# Patient Record
Sex: Female | Born: 1955 | Race: White | Hispanic: No | Marital: Single
Health system: Southern US, Community
[De-identification: ages and names within clinical notes are randomized; demographics above are authoritative.]

---

## 1999-06-22 ENCOUNTER — Other Ambulatory Visit: Admission: RE | Admit: 1999-06-22 | Discharge: 1999-06-22 | Payer: Self-pay | Admitting: Obstetrics and Gynecology

## 2000-07-03 ENCOUNTER — Other Ambulatory Visit: Admission: RE | Admit: 2000-07-03 | Discharge: 2000-07-03 | Payer: Self-pay | Admitting: Obstetrics and Gynecology

## 2001-07-10 ENCOUNTER — Other Ambulatory Visit: Admission: RE | Admit: 2001-07-10 | Discharge: 2001-07-10 | Payer: Self-pay | Admitting: Obstetrics and Gynecology

## 2002-05-12 ENCOUNTER — Other Ambulatory Visit: Admission: RE | Admit: 2002-05-12 | Discharge: 2002-05-12 | Payer: Self-pay | Admitting: Obstetrics and Gynecology

## 2002-08-06 ENCOUNTER — Other Ambulatory Visit: Admission: RE | Admit: 2002-08-06 | Discharge: 2002-08-06 | Payer: Self-pay | Admitting: Obstetrics and Gynecology

## 2003-08-20 ENCOUNTER — Other Ambulatory Visit: Admission: RE | Admit: 2003-08-20 | Discharge: 2003-08-20 | Payer: Self-pay | Admitting: Obstetrics and Gynecology

## 2003-09-08 ENCOUNTER — Ambulatory Visit (HOSPITAL_BASED_OUTPATIENT_CLINIC_OR_DEPARTMENT_OTHER): Admission: RE | Admit: 2003-09-08 | Discharge: 2003-09-08 | Payer: Self-pay | Admitting: Surgery

## 2003-09-08 ENCOUNTER — Encounter (INDEPENDENT_AMBULATORY_CARE_PROVIDER_SITE_OTHER): Payer: Self-pay | Admitting: *Deleted

## 2003-09-08 ENCOUNTER — Ambulatory Visit (HOSPITAL_COMMUNITY): Admission: RE | Admit: 2003-09-08 | Discharge: 2003-09-08 | Payer: Self-pay | Admitting: Surgery

## 2004-08-23 ENCOUNTER — Other Ambulatory Visit: Admission: RE | Admit: 2004-08-23 | Discharge: 2004-08-23 | Payer: Self-pay | Admitting: Obstetrics and Gynecology

## 2005-05-25 ENCOUNTER — Encounter: Admission: RE | Admit: 2005-05-25 | Discharge: 2005-05-25 | Payer: Self-pay | Admitting: Obstetrics and Gynecology

## 2005-09-14 ENCOUNTER — Encounter: Admission: RE | Admit: 2005-09-14 | Discharge: 2005-09-14 | Payer: Self-pay | Admitting: Obstetrics and Gynecology

## 2005-09-17 ENCOUNTER — Other Ambulatory Visit: Admission: RE | Admit: 2005-09-17 | Discharge: 2005-09-17 | Payer: Self-pay | Admitting: Obstetrics and Gynecology

## 2006-09-24 ENCOUNTER — Encounter: Admission: RE | Admit: 2006-09-24 | Discharge: 2006-09-24 | Payer: Self-pay | Admitting: Obstetrics and Gynecology

## 2007-10-09 ENCOUNTER — Encounter: Admission: RE | Admit: 2007-10-09 | Discharge: 2007-10-09 | Payer: Self-pay | Admitting: Obstetrics and Gynecology

## 2007-10-15 ENCOUNTER — Encounter: Admission: RE | Admit: 2007-10-15 | Discharge: 2007-10-15 | Payer: Self-pay | Admitting: Obstetrics and Gynecology

## 2008-03-18 ENCOUNTER — Ambulatory Visit (HOSPITAL_COMMUNITY): Admission: RE | Admit: 2008-03-18 | Discharge: 2008-03-18 | Payer: Self-pay | Admitting: Obstetrics and Gynecology

## 2008-10-12 ENCOUNTER — Encounter: Admission: RE | Admit: 2008-10-12 | Discharge: 2008-10-12 | Payer: Self-pay | Admitting: Obstetrics and Gynecology

## 2009-10-20 ENCOUNTER — Encounter: Admission: RE | Admit: 2009-10-20 | Discharge: 2009-10-20 | Payer: Self-pay | Admitting: Obstetrics and Gynecology

## 2009-12-15 IMAGING — CT CT ABDOMEN W/ CM
2 of 5 series · 17 of 46 positions shown, 19 images · IV contrast (agent unspecified)
Comparison: No priors

CT ABDOMEN

CLINICAL DATA: Pelvic and left flank pain

CT ABDOMEN AND PELVIS WITH CONTRAST
TECHNIQUE: Multidetector CT imaging of the abdomen and pelvis was
performed using the standard protocol following bolus
administration of intravenous contrast.
Contrast: 100 ml Smnipaque-KPP

[Series 2: abd_pel 5.0 b40s · axial · 0.70mm/px · z∈[+912,+1307]mm · 14 of 89 slices shown, 16 images]
[im 5/89  soft-tissue]
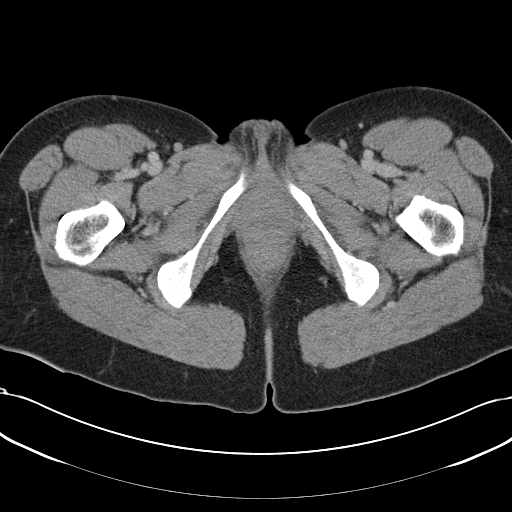
[im 5/89  bone]
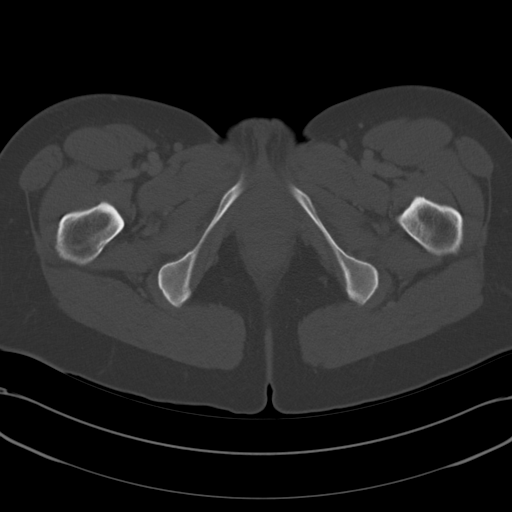
[im 14/89  soft-tissue]
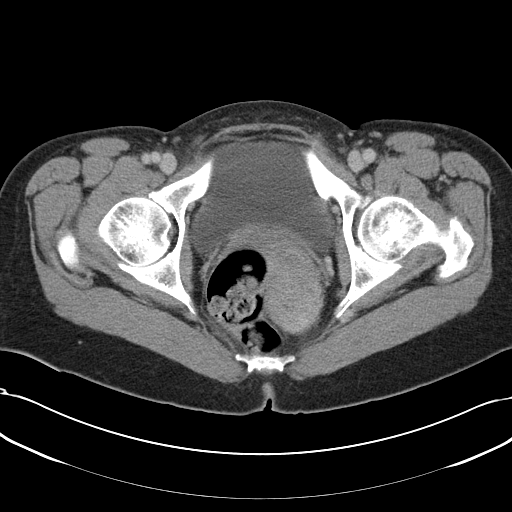
[im 18/89  soft-tissue]
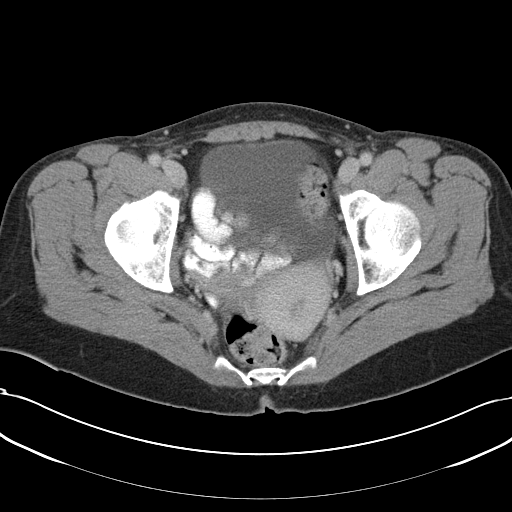
[im 23/89  soft-tissue]
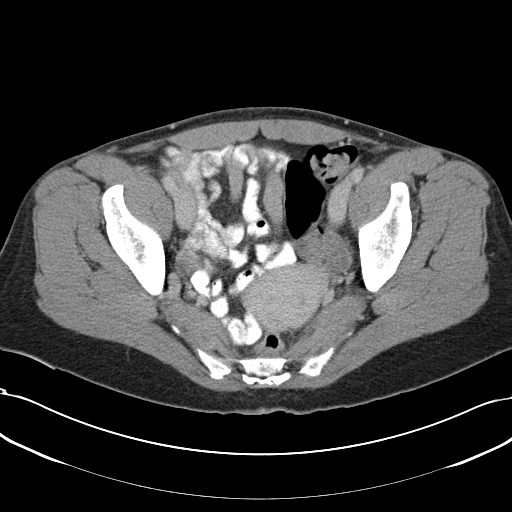
[im 31/89  soft-tissue]
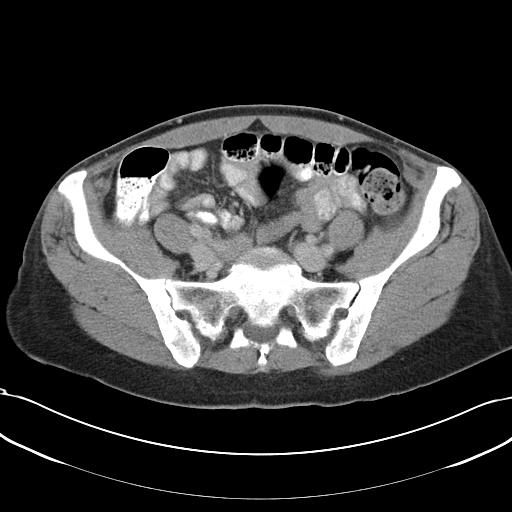
[im 36/89  soft-tissue]
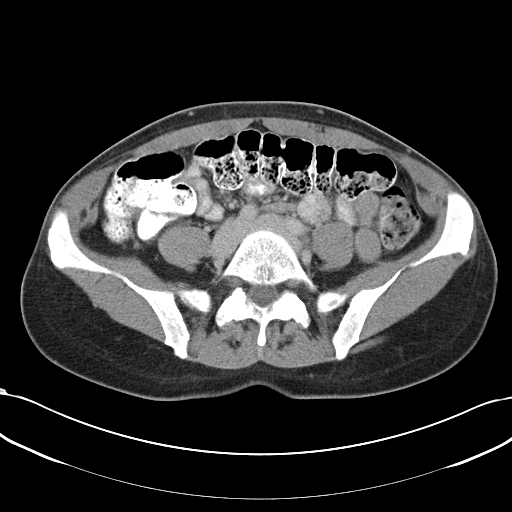
[im 40/89  soft-tissue]
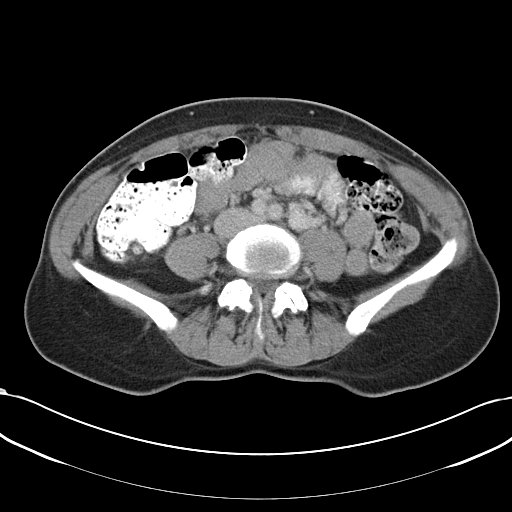
[im 49/89  soft-tissue]
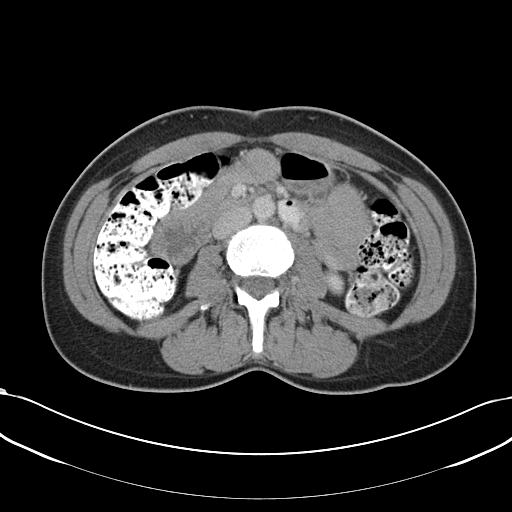
[im 53/89  soft-tissue]
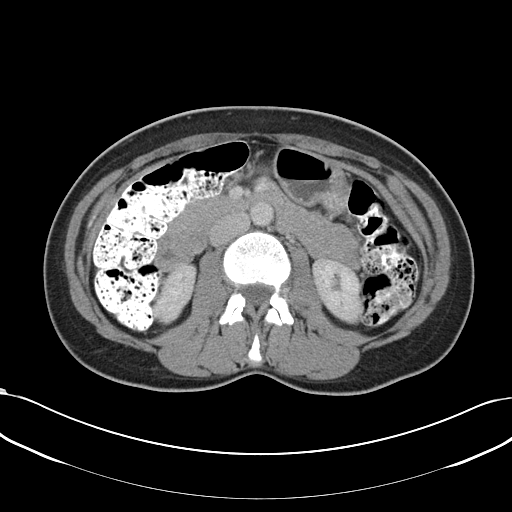
[im 53/89  bone]
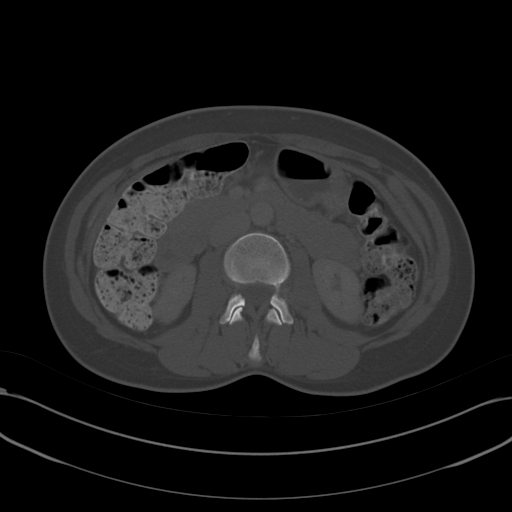
[im 58/89  soft-tissue]
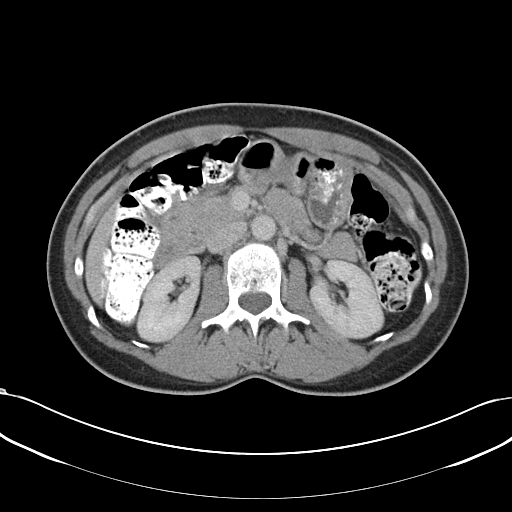
[im 67/89  soft-tissue]
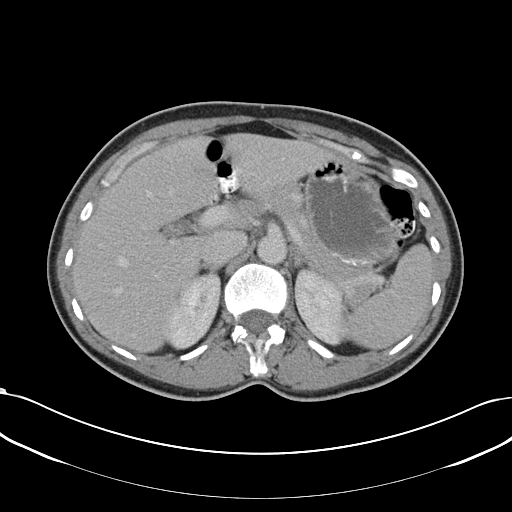
[im 71/89  soft-tissue]
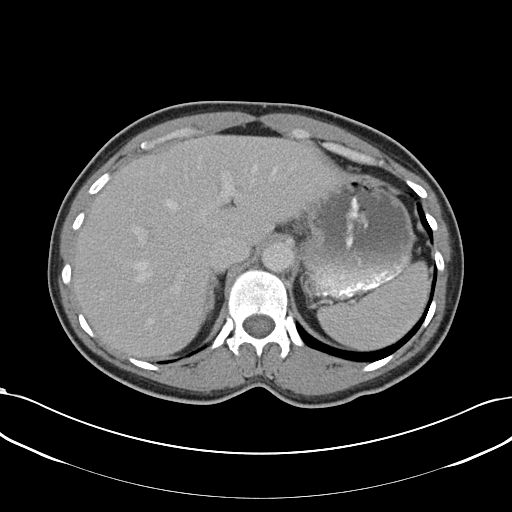
[im 75/89  soft-tissue]
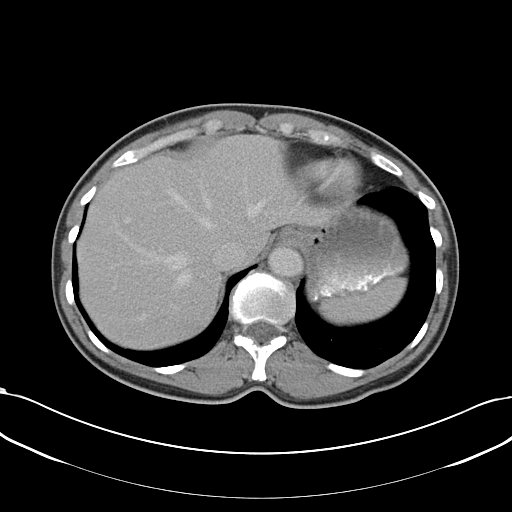
[im 84/89  soft-tissue]
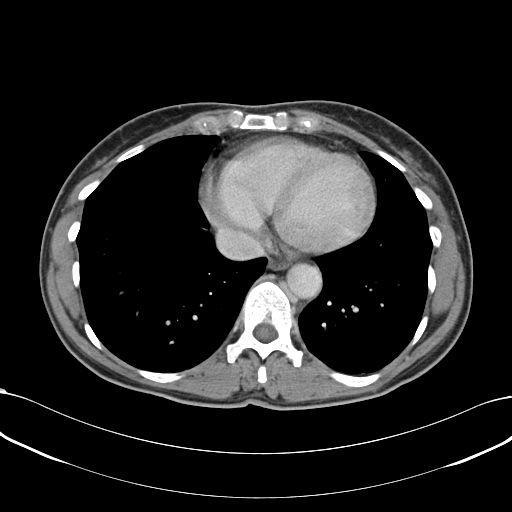

[Series 602: coronal images · coronal · 0.87mm/px · 3 of 70 slices shown]
[im 24/70  soft-tissue]
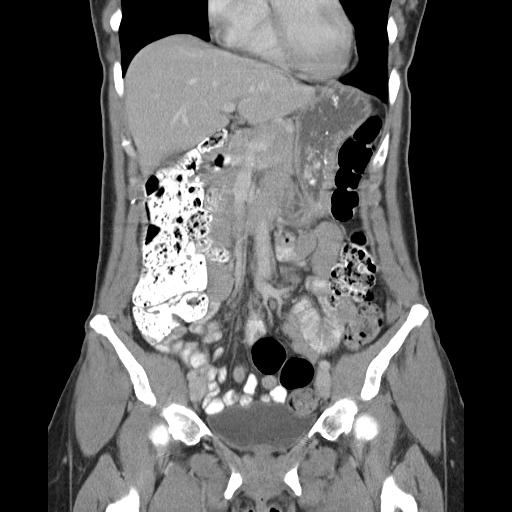
[im 31/70  soft-tissue]
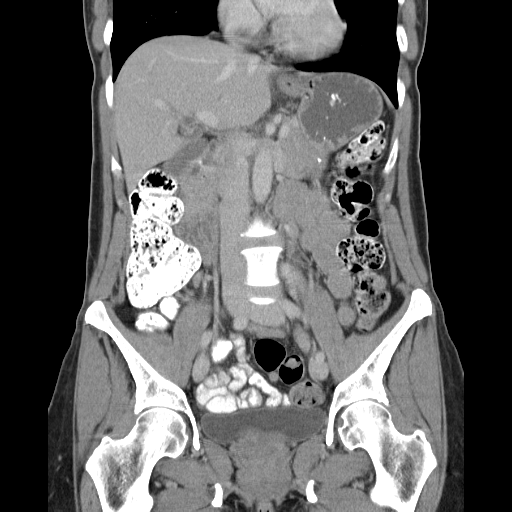
[im 39/70  soft-tissue]
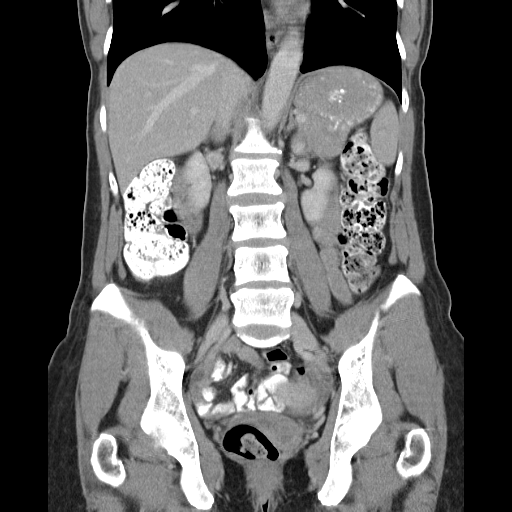

[17 of 46 positions shown; findings below may reference images not displayed]

FINDINGS: The lung bases are clear.  Liver, spleen, pancreas, and
adrenal glands normal.  There is a delayed images of the kidneys
show no obstruction or other abnormality.

No calcified gallstones or biliary dilatation.  No adenopathy or
ascites.  Visualized bowel normal.
IMPRESSION: Normal examination

CT PELVIS
FINDINGS: No focal masses, adenopathy, or fluid collections.  No
free pelvic fluid.  Pelvic sidewalls and presacral space normal.
No inflammatory changes in the large or small bowel.  I do not see
the appendix, but there are no inflammatory changes in the region
of the appendix.

No evidence for ureteral dilatation or obstruction.  No bladder
stones or abnormality of the urinary tract..
IMPRESSION: No acute or significant findings.

## 2010-10-02 ENCOUNTER — Other Ambulatory Visit: Payer: Self-pay | Admitting: Obstetrics and Gynecology

## 2010-10-02 DIAGNOSIS — Z1231 Encounter for screening mammogram for malignant neoplasm of breast: Secondary | ICD-10-CM

## 2010-10-24 ENCOUNTER — Ambulatory Visit: Payer: Self-pay

## 2010-10-27 ENCOUNTER — Other Ambulatory Visit: Payer: Self-pay | Admitting: Obstetrics and Gynecology

## 2010-11-01 ENCOUNTER — Ambulatory Visit
Admission: RE | Admit: 2010-11-01 | Discharge: 2010-11-01 | Disposition: A | Discharge status: Home / Self Care | Payer: BC Managed Care – PPO | Source: Ambulatory Visit | Attending: Obstetrics and Gynecology | Admitting: Obstetrics and Gynecology

## 2010-11-01 DIAGNOSIS — Z1231 Encounter for screening mammogram for malignant neoplasm of breast: Secondary | ICD-10-CM

## 2010-11-03 NOTE — Op Note (Signed)
NAME:  Penny Hodges, Penny Hodges                       ACCOUNT NO.:  [id number]   MEDICAL RECORD NO.:  [id number]                   PATIENT TYPE:  AMB   LOCATION:  [facility]                                  FACILITY:  [facility]   PHYSICIAN:  Currie Paris, M.D.           DATE OF BIRTH:  May 05, 1956   DATE OF PROCEDURE:  09/08/2003  DATE OF DISCHARGE:                                 OPERATIVE REPORT   CCS [phone]   PREOPERATIVE DIAGNOSIS:  Complex cystic mass, right breast upper outer  quadrant.   POSTOPERATIVE DIAGNOSIS:  Complex cystic mass, right breast upper outer  quadrant.   OPERATION PERFORMED:  Excision of cystic mass, right upper outer quadrant of  the breast.   SURGEON:  Currie Paris, M.D.   ANESTHESIA:  MAC.   INDICATIONS FOR PROCEDURE:  The patient is a 55 year old with a  progressively more abnormal-looking complex cystic area in the upper outer  quadrant of the right breast which was palpable.  She desired to have an  excisional biopsy done.   DESCRIPTION OF PROCEDURE:  The patient was seen in the holding area and had  no further questions.  The right breast was identified and marked as the  operative side.  In the operating room and prior to the administration of IV  sedation, the mass was palpated and identified and marked.  Ultrasound  confirmed it.   The patient was then given IV sedation and the breast prepped and draped.  1% Xylocaine was used for local and infiltrated over the mass.  A short  curvilinear incision directly over the mass which was in the 10 o'clock  position was made.  Subcutaneous tissue was divided and I could see what  appeared to be the top of a cystic area.  The breast tissue around it was  grasped and excisional biopsy of this area taken.  There appeared to be one  cyst that I got into that had some creamy material in it and a second  smaller cyst that had clear material.  There still seemed to be some  residual mass here that may have been  additional cystic component.  Smaller  cysts were noted the vicinity, all 1 to 2  mm.  Once the mass was excised and a little bit of tissue around it that  felt a little firm. The area was checked for hemostasis and once dry was  closed with 3-0 Vicryl followed by 4-0 Monocryl subcuticular and Dermabond.   The patient tolerated the procedure well.  There were no operative  complications. All counts were correct.                                               Currie Paris, M.D.    CJS/MEDQ  D:  09/08/2003  T:  09/09/2003  Job:  [id number]   cc:   Dr. Herbie Saxon, M.D.  [address]  [place]  [place] [address]  Fax: [phone]   Juluis Mire, M.D.  [address]  [place]  [place] [address]  Fax: [phone]

## 2011-11-26 ENCOUNTER — Other Ambulatory Visit: Payer: Self-pay | Admitting: Obstetrics and Gynecology

## 2011-11-26 DIAGNOSIS — Z1231 Encounter for screening mammogram for malignant neoplasm of breast: Secondary | ICD-10-CM

## 2011-12-04 ENCOUNTER — Ambulatory Visit
Admission: RE | Admit: 2011-12-04 | Discharge: 2011-12-04 | Disposition: A | Discharge status: Home / Self Care | Payer: BC Managed Care – PPO | Source: Ambulatory Visit | Attending: Obstetrics and Gynecology | Admitting: Obstetrics and Gynecology

## 2011-12-04 DIAGNOSIS — Z1231 Encounter for screening mammogram for malignant neoplasm of breast: Secondary | ICD-10-CM

## 2011-12-12 ENCOUNTER — Other Ambulatory Visit: Payer: Self-pay | Admitting: Obstetrics and Gynecology

## 2012-12-08 ENCOUNTER — Other Ambulatory Visit: Payer: Self-pay

## 2012-12-08 DIAGNOSIS — Z1231 Encounter for screening mammogram for malignant neoplasm of breast: Secondary | ICD-10-CM

## 2012-12-15 ENCOUNTER — Ambulatory Visit: Payer: BC Managed Care – PPO

## 2012-12-30 ENCOUNTER — Ambulatory Visit
Admission: RE | Admit: 2012-12-30 | Discharge: 2012-12-30 | Disposition: A | Discharge status: Home / Self Care | Payer: BC Managed Care – PPO | Source: Ambulatory Visit

## 2012-12-30 DIAGNOSIS — Z1231 Encounter for screening mammogram for malignant neoplasm of breast: Secondary | ICD-10-CM

## 2013-12-31 ENCOUNTER — Other Ambulatory Visit: Payer: Self-pay

## 2013-12-31 DIAGNOSIS — Z1231 Encounter for screening mammogram for malignant neoplasm of breast: Secondary | ICD-10-CM

## 2014-01-04 ENCOUNTER — Ambulatory Visit
Admission: RE | Admit: 2014-01-04 | Discharge: 2014-01-04 | Disposition: A | Discharge status: Home / Self Care | Payer: BC Managed Care – PPO | Source: Ambulatory Visit

## 2014-01-04 DIAGNOSIS — Z1231 Encounter for screening mammogram for malignant neoplasm of breast: Secondary | ICD-10-CM

## 2014-01-07 ENCOUNTER — Other Ambulatory Visit: Payer: Self-pay | Admitting: Obstetrics and Gynecology

## 2014-01-08 LAB — CYTOLOGY - PAP

## 2015-01-11 ENCOUNTER — Other Ambulatory Visit: Payer: Self-pay

## 2015-01-11 DIAGNOSIS — Z1231 Encounter for screening mammogram for malignant neoplasm of breast: Secondary | ICD-10-CM

## 2015-01-24 ENCOUNTER — Ambulatory Visit
Admission: RE | Admit: 2015-01-24 | Discharge: 2015-01-24 | Disposition: A | Discharge status: Home / Self Care | Payer: BLUE CROSS/BLUE SHIELD | Source: Ambulatory Visit

## 2015-01-24 DIAGNOSIS — Z1231 Encounter for screening mammogram for malignant neoplasm of breast: Secondary | ICD-10-CM

## 2019-08-27 ENCOUNTER — Ambulatory Visit: Payer: 59 | Attending: Internal Medicine

## 2019-08-27 DIAGNOSIS — Z23 Encounter for immunization: Secondary | ICD-10-CM

## 2019-08-27 NOTE — Progress Notes (Signed)
   Covid-19 Vaccination Clinic  Name:  Anays Detore    MRN: [id number] DOB: Oct 03, 1955  08/27/2019  Ms. Louk was observed post Covid-19 immunization for 15 minutes without incident. She was provided with Vaccine Information Sheet and instruction to access the V-Safe system.   Ms. Beggs was instructed to call 911 with any severe reactions post vaccine: Marland Kitchen Difficulty breathing  . Swelling of face and throat  . A fast heartbeat  . A bad rash all over body  . Dizziness and weakness   Immunizations Administered    Name Date Dose VIS Date Route   Pfizer COVID-19 Vaccine 08/27/2019  8:32 AM 0.3 mL 05/29/2019 Intramuscular   Manufacturer: [employer], [employer]   Lot: [id number]   NDC: [address]-7

## 2019-09-21 ENCOUNTER — Ambulatory Visit: Payer: 59

## 2019-09-21 ENCOUNTER — Ambulatory Visit: Payer: 59 | Attending: Internal Medicine

## 2019-09-21 DIAGNOSIS — Z23 Encounter for immunization: Secondary | ICD-10-CM

## 2019-09-21 NOTE — Progress Notes (Signed)
   Covid-19 Vaccination Clinic  Name:  Penny Hodges    MRN: [id number] DOB: [birth year]  09/21/2019  [name] was observed post Covid-19 immunization for 15 minutes without incident. She was provided with Vaccine Information Sheet and instruction to access the V-Safe system.   [name] was instructed to call 911 with any severe reactions post vaccine: Marland Kitchen Difficulty breathing  . Swelling of face and throat  . A fast heartbeat  . A bad rash all over body  . Dizziness and weakness   Immunizations Administered    Name Date Dose VIS Date Route   Pfizer COVID-19 Vaccine 09/21/2019 11:10 AM 0.3 mL 05/29/2019 Intramuscular   Manufacturer: [employer], [employer]   Lot: [id number]   NDC: [address]-2

## 2021-02-09 ENCOUNTER — Other Ambulatory Visit: Payer: Self-pay | Admitting: Otolaryngology

## 2021-02-09 DIAGNOSIS — R519 Headache, unspecified: Secondary | ICD-10-CM
